# Patient Record
Sex: Female | Born: 1990 | Race: White | Hispanic: Yes | Marital: Single | Smoking: Current every day smoker
Health system: Southern US, Community
[De-identification: ages and names within clinical notes are randomized; demographics above are authoritative.]

## PROBLEM LIST (undated history)

## (undated) DIAGNOSIS — J45909 Unspecified asthma, uncomplicated: Secondary | ICD-10-CM

## (undated) HISTORY — PX: CYST EXCISION: SHX5701

---
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
## 2013-11-17 ENCOUNTER — Emergency Department (HOSPITAL_BASED_OUTPATIENT_CLINIC_OR_DEPARTMENT_OTHER): Payer: Self-pay

## 2013-11-17 ENCOUNTER — Encounter (HOSPITAL_BASED_OUTPATIENT_CLINIC_OR_DEPARTMENT_OTHER): Payer: Self-pay | Admitting: Emergency Medicine

## 2013-11-17 ENCOUNTER — Emergency Department (HOSPITAL_BASED_OUTPATIENT_CLINIC_OR_DEPARTMENT_OTHER)
Admission: EM | Admit: 2013-11-17 | Discharge: 2013-11-17 | Disposition: A | Discharge status: Home / Self Care | Payer: Self-pay | Attending: Emergency Medicine | Admitting: Emergency Medicine

## 2013-11-17 DIAGNOSIS — N390 Urinary tract infection, site not specified: Secondary | ICD-10-CM | POA: Insufficient documentation

## 2013-11-17 DIAGNOSIS — R110 Nausea: Secondary | ICD-10-CM

## 2013-11-17 DIAGNOSIS — R197 Diarrhea, unspecified: Secondary | ICD-10-CM | POA: Insufficient documentation

## 2013-11-17 DIAGNOSIS — R079 Chest pain, unspecified: Secondary | ICD-10-CM | POA: Insufficient documentation

## 2013-11-17 DIAGNOSIS — J45909 Unspecified asthma, uncomplicated: Secondary | ICD-10-CM | POA: Insufficient documentation

## 2013-11-17 DIAGNOSIS — R112 Nausea with vomiting, unspecified: Secondary | ICD-10-CM | POA: Insufficient documentation

## 2013-11-17 DIAGNOSIS — A5901 Trichomonal vulvovaginitis: Secondary | ICD-10-CM | POA: Insufficient documentation

## 2013-11-17 DIAGNOSIS — Z79899 Other long term (current) drug therapy: Secondary | ICD-10-CM | POA: Insufficient documentation

## 2013-11-17 DIAGNOSIS — Z3202 Encounter for pregnancy test, result negative: Secondary | ICD-10-CM | POA: Insufficient documentation

## 2013-11-17 DIAGNOSIS — G4489 Other headache syndrome: Secondary | ICD-10-CM | POA: Insufficient documentation

## 2013-11-17 DIAGNOSIS — Z720 Tobacco use: Secondary | ICD-10-CM | POA: Insufficient documentation

## 2013-11-17 HISTORY — DX: Unspecified asthma, uncomplicated: J45.909

## 2013-11-17 LAB — WET PREP, GENITAL: Yeast Wet Prep HPF POC: NONE SEEN

## 2013-11-17 LAB — URINALYSIS, ROUTINE W REFLEX MICROSCOPIC
GLUCOSE, UA: NEGATIVE mg/dL
Hgb urine dipstick: NEGATIVE
KETONES UR: 15 mg/dL — AB
Nitrite: NEGATIVE
PH: 6 (ref 5.0–8.0)
Protein, ur: NEGATIVE mg/dL
Specific Gravity, Urine: 1.025 (ref 1.005–1.030)
Urobilinogen, UA: 0.2 mg/dL (ref 0.0–1.0)

## 2013-11-17 LAB — COMPREHENSIVE METABOLIC PANEL
ALBUMIN: 3.8 g/dL (ref 3.5–5.2)
ALK PHOS: 60 U/L (ref 39–117)
ALT: 51 U/L — AB (ref 0–35)
AST: 34 U/L (ref 0–37)
Anion gap: 15 (ref 5–15)
BUN: 4 mg/dL — ABNORMAL LOW (ref 6–23)
CHLORIDE: 103 meq/L (ref 96–112)
CO2: 24 mEq/L (ref 19–32)
Calcium: 9.7 mg/dL (ref 8.4–10.5)
Creatinine, Ser: 0.7 mg/dL (ref 0.50–1.10)
GFR calc Af Amer: >90 mL/min (ref >90)
GFR calc non Af Amer: >90 mL/min (ref >90)
GLUCOSE: 91 mg/dL (ref 70–99)
POTASSIUM: 4.1 meq/L (ref 3.7–5.3)
Sodium: 142 mEq/L (ref 137–147)
TOTAL PROTEIN: 7.9 g/dL (ref 6.0–8.3)
Total Bilirubin: 0.2 mg/dL — ABNORMAL LOW (ref 0.3–1.2)

## 2013-11-17 LAB — CBC
HEMATOCRIT: 43.4 % (ref 36.0–46.0)
HEMOGLOBIN: 14.7 g/dL (ref 12.0–15.0)
MCH: 29.8 pg (ref 26.0–34.0)
MCHC: 33.9 g/dL (ref 30.0–36.0)
MCV: 88 fL (ref 78.0–100.0)
Platelets: 344 10*3/uL (ref 150–400)
RBC: 4.93 MIL/uL (ref 3.87–5.11)
RDW: 13.1 % (ref 11.5–15.5)
WBC: 11.7 10*3/uL — AB (ref 4.0–10.5)

## 2013-11-17 LAB — URINE MICROSCOPIC-ADD ON

## 2013-11-17 LAB — PREGNANCY, URINE: PREG TEST UR: NEGATIVE

## 2013-11-17 MED ORDER — KETOROLAC TROMETHAMINE 30 MG/ML IJ SOLN
30.0000 mg | Freq: Once | INTRAMUSCULAR | Status: AC
  Administered 2013-11-17: 30 mg via INTRAVENOUS
  Filled 2013-11-17: qty 1

## 2013-11-17 MED ORDER — KETOROLAC TROMETHAMINE 30 MG/ML IJ SOLN
INTRAMUSCULAR | Status: AC
  Filled 2013-11-17: qty 1

## 2013-11-17 MED ORDER — SODIUM CHLORIDE 0.9 % IV BOLUS (SEPSIS)
1000.0000 mL | Freq: Once | INTRAVENOUS | Status: AC
  Administered 2013-11-17: 1000 mL via INTRAVENOUS

## 2013-11-17 MED ORDER — PROMETHAZINE HCL 25 MG RE SUPP: 25.0000 mg | Freq: Four times a day (QID) | RECTAL | Status: DC | PRN

## 2013-11-17 MED ORDER — METRONIDAZOLE 500 MG PO TABS: 500.0000 mg | ORAL_TABLET | Freq: Two times a day (BID) | ORAL | Status: DC

## 2013-11-17 MED ORDER — CEPHALEXIN 500 MG PO CAPS: 500.0000 mg | ORAL_CAPSULE | Freq: Three times a day (TID) | ORAL | Status: DC

## 2013-11-17 MED ORDER — AZITHROMYCIN 250 MG PO TABS
1000.0000 mg | ORAL_TABLET | Freq: Once | ORAL | Status: AC
  Administered 2013-11-17: 1000 mg via ORAL
  Filled 2013-11-17: qty 4

## 2013-11-17 MED ORDER — DICYCLOMINE HCL 10 MG/ML IM SOLN
20.0000 mg | Freq: Once | INTRAMUSCULAR | Status: AC
  Administered 2013-11-17: 20 mg via INTRAMUSCULAR
  Filled 2013-11-17: qty 2

## 2013-11-17 MED ORDER — ONDANSETRON HCL 4 MG PO TABS: 4.0000 mg | ORAL_TABLET | Freq: Four times a day (QID) | ORAL | Status: DC

## 2013-11-17 MED ORDER — ONDANSETRON HCL 4 MG/2ML IJ SOLN
4.0000 mg | Freq: Once | INTRAMUSCULAR | Status: AC
  Administered 2013-11-17: 4 mg via INTRAVENOUS
  Filled 2013-11-17: qty 2

## 2013-11-17 MED ORDER — CEFTRIAXONE SODIUM 250 MG IJ SOLR
250.0000 mg | Freq: Once | INTRAMUSCULAR | Status: AC
  Administered 2013-11-17: 250 mg via INTRAMUSCULAR
  Filled 2013-11-17: qty 250

## 2013-11-17 NOTE — ED Notes (Signed)
Per RN Pt given gram crackers and ginger ale

## 2013-11-17 NOTE — Discharge Instructions (Signed)
Call for a follow up appointment with a Family or Primary Care Provider for your ongoing headache, chest pain, diarrhea, abnormal vaginal discharge. Your chest X-ray was normal, your EKG was without concerning abnormalities. Call a cardiologist for further evaluation of your chest pain. Return if Symptoms worsen.   Take medication as prescribed.  Drink plenty of fluids.  You have been treated in the emergency department for an infection, possibly sexually transmitted. Results of your gonorrhea and chlamydia tests are pending and you will be notified if they are positive. It is very important to practice safe sex and use condoms when sexually active. If your results are positive you need to notify all sexual partners so they can be treated as well. The website https://garcia.net/http://www.dontspreadit.com/ can be used to send anonymous text messages or emails to alert sexual contacts. Follow up with your doctor, or OBGYN in regards to today's visit.    Gonorrhea and Chlamydia SYMPTOMS  In females, symptoms may go unnoticed. Symptoms that are more noticeable can include:  Belly (abdominal) pain.  Painful intercourse.  Watery mucous-like discharge from the vagina.  Miscarriage.  Discomfort when urinating.  Inflammation of the rectum.  Abnormal gray-green frothy vaginal discharge  Vaginal itching and irritatio  Itching and irritation of the area outside the vagina.   Painful urination.  Bleeding after sexual intercourse.  In males, symptoms include:  Burning with urination.  Pain in the testicles.  Watery mucous-like discharge from the penis.  It can cause longstanding (chronic) pelvic pain after frequent infections.  TREATMENT  PID can cause women to not be able to have children (sterile) if left untreated or if half-treated.  It is important to finish ALL medications given to you.  This is a sexually transmitted infection. So you are also at risk for other sexually transmitted diseases, including HIV  (AIDS), it is recommended that you get tested. HOME CARE INSTRUCTIONS  Warning: This infection is contagious. Do not have sex until treatment is completed. Follow up at your caregiver's office or the clinic to which you were referred. If your diagnosis (learning what is wrong) is confirmed by culture or some other method, your recent sexual contacts need treatment. Even if they are symptom free or have a negative culture or evaluation, they should be treated.  PREVENTION  Women should use sanitary pads instead of tampons for vaginal discharge.  Wipe front to back after using the toilet and avoid douching.   Practice safe sex, use condoms, have only one sex partner and be sure your sex partner is not having sex with others.  Ask your caregiver to test you for chlamydia at your regular checkups or sooner if you are having symptoms.  Ask for further information if you are pregnant.  SEEK IMMEDIATE MEDICAL CARE IF:  You develop an oral temperature above 102 F (38.9 C), not controlled by medications or lasting more than 2 days.  You develop an increase in pain.  You develop any type of abnormal discharge.  You develop vaginal bleeding and it is not time for your period.  You develop painful intercourse.   Bacterial Vaginosis  Bacterial vaginosis (BV) is a vaginal infection where the normal balance of bacteria in the vagina is disrupted. This is not a sexually transmitted disease and your sexual partners do NOT need to be treated. CAUSES  The cause of BV is not fully understood. BV develops when there is an increase or imbalance of harmful bacteria.  Some activities or behaviors can upset  the normal balance of bacteria in the vagina and put women at increased risk including:  Having a new sex partner or multiple sex partners.  Douching.  Using an intrauterine device (IUD) for contraception.  It is not clear what role sexual activity plays in the development of BV. However, women that have never  had sexual intercourse are rarely infected with BV.  Women do not get BV from toilet seats, bedding, swimming pools or from touching objects around them.   SYMPTOMS  Grey vaginal discharge.  A fish-like odor with discharge, especially after sexual intercourse.  Itching or burning of the vagina and vulva.  Burning or pain with urination.  Some women have no signs or symptoms at all.   TREATMENT  Sometimes BV will clear up without treatment.  BV may be treated with antibiotics.  BV can recur after treatment. If this happens, a second round of antibiotics will often be prescribed.  HOME CARE INSTRUCTIONS  Finish all medication as directed by your caregiver.  Do not have sex until treatment is completed.  Do NOT drink any alcoholic beverages while being treated  with Metronidazole (Flagyl). This will cause a severe reaction inducing vomiting.  RESOURCE GUIDE  Dental Problems  Patients with Medicaid: [facility]                     [facility] [phone]                                           [phone] Phone:  [phone]                                                  Phone:  [phone]  If unable to pay or uninsured, contact:  [facility] or [facility] to become qualified for the adult dental clinic.  Chronic Pain Problems Contact [facility]  [phone] Patients need to be referred by their primary care doctor.  Insufficient Money for Medicine Contact United Way:  call "211" or [facility] [phone].  No Primary Care Doctor Call [facility]  [phone] Other agencies that provide inexpensive medical care    [facility]  [phone]    [facility]  [phone]    [facility]  [phone]    [facility]  [phone]    [facility]  [phone]    [facility]  [phone]  Psychological Services [facility]  [phone] [facility]   [phone] [facility]   [phone] (emergency services [phone]  Substance Abuse Resources [facility]  [phone] [facility] [phone] [facility] [phone] [facility] [phone] Residential & Outpatient Substance Abuse Program  [phone]  Abuse/Neglect [facility] Hotline [phone] [facility] Hotline 617 [phone] (After Hours)  Emergency Shelter [facility] [phone]  M[facility] [place] [facility] [place] [phone] [facility] [phone]  MRSA Hotline #:   936-696-8240506 [phone]    [facility]  [facility]     [employer]  [facility] [address]      [place] Phone:  [phone]                                   Phone:  [phone]                 Phone:  [place] Phone:  [place] [phone] (After Hours)

## 2013-11-17 NOTE — ED Provider Notes (Signed)
CSN: [id number]     Arrival date & time 11/17/13  1751 History   First MD Initiated Contact with Patient 11/17/13 1826     Chief Complaint  Patient presents with  . Nausea     (Consider location/radiation/quality/duration/timing/severity/associated sxs/prior Treatment) HPI Comments: The patient is a 23 year old female history of Trichomonas  presenting to the emergency department multiple complaints. The patient reports nausea over the last 2 weeks, with 2-3 episodes of loose stool per day. She reports one episode of nonbloody emesis. She reports low abdominal pain. She denies urinary symptoms. Patient reports abnormal vaginal discharge onset this week. She reports sexually active uses condoms with every sexual encounter. Patient's father is concerned about H. pylori infection. Patient complains of chest pain intermittent several weeks, last episode yesterday nonexertional. Patient also complains of headache, occurring intermittently over the last 1 year, usually occurring in the morning. She reports headache "coming from her eyes" and was advised to get glasses, she reports getting glasses last year without full resolution of symptoms. The patient denies treatment prior to arrival. States might be related to bleach use in drain due to sewage backup at current residence. NO PCP   The history is provided by the patient. No language interpreter was used.    Past Medical History  Diagnosis Date  . Asthma    Past Surgical History  Procedure Laterality Date  . Cyst excision     No family history on file. History  Substance Use Topics  . Smoking status: Current Every Day Smoker    Types: Cigarettes  . Smokeless tobacco: Never Used  . Alcohol Use: 0.6 oz/week    1 Glasses of wine per week   OB History   Grav Para Term Preterm Abortions TAB SAB Ect Mult Living                 Review of Systems  Constitutional: Negative for fever and chills.  Respiratory: Negative for cough and  shortness of breath.   Cardiovascular: Positive for chest pain. Negative for palpitations and leg swelling.  Gastrointestinal: Positive for nausea, abdominal pain and diarrhea. Negative for blood in stool.  Genitourinary: Positive for vaginal discharge. Negative for dysuria and hematuria.      Allergies  Review of patient's allergies indicates no known allergies.  Home Medications   Prior to Admission medications   Medication Sig Start Date End Date Taking? Authorizing Provider  busPIRone (BUSPAR) 10 MG tablet Take 10 mg by mouth 3 (three) times daily.   Yes Historical Provider, MD  RANITIDINE HCL PO Take by mouth.   Yes Historical Provider, MD   BP 151/70  Pulse 103  Temp(Src) 97.9 F (36.6 C) (Oral)  Resp 20  SpO2 97%  LMP 11/03/2013 Physical Exam  Nursing note and vitals reviewed. Constitutional: She is oriented to person, place, and time. She appears well-developed and well-nourished.  Non-toxic appearance. She does not have a sickly appearance. She does not appear ill. No distress.  Pt reaching up turning on TV upon entering room.  HENT:  Head: Normocephalic and atraumatic.  Eyes: EOM are normal. Pupils are equal, round, and reactive to light.  Neck: Neck supple.  Cardiovascular: Normal rate and regular rhythm.   No lower extremity edema  Pulmonary/Chest: Effort normal. She has no wheezes. She has no rales.  Patient able to speak in complete sentences  Abdominal: Soft. There is tenderness in the suprapubic area. There is no rebound and no guarding.  Obese abdomen. Mild suprapubic discomfort  with palpation.  Genitourinary: Cervix exhibits motion tenderness and friability. Right adnexum displays no mass and no tenderness. Left adnexum displays no mass and no tenderness.  Moderate amount of green discharge and posterior cervical vault.Chaperone present.    Neurological: She is alert and oriented to person, place, and time.  Skin: Skin is warm and dry. She is not  diaphoretic.  Psychiatric: She has a normal mood and affect. Her behavior is normal.    ED Course  Procedures (including critical care time) Labs Review Labs Reviewed  WET PREP, GENITAL - Abnormal; Notable for the following:    Trich, Wet Prep TOO NUMEROUS TO COUNT (*)    Clue Cells Wet Prep HPF POC TOO NUMEROUS TO COUNT (*)    WBC, Wet Prep HPF POC TOO NUMEROUS TO COUNT (*)    All other components within normal limits  URINALYSIS, ROUTINE W REFLEX MICROSCOPIC - Abnormal; Notable for the following:    APPearance CLOUDY (*)    Bilirubin Urine SMALL (*)    Ketones, ur 15 (*)    Leukocytes, UA LARGE (*)    All other components within normal limits  CBC - Abnormal; Notable for the following:    WBC 11.7 (*)    All other components within normal limits  COMPREHENSIVE METABOLIC PANEL - Abnormal; Notable for the following:    BUN 4 (*)    ALT 51 (*)    Total Bilirubin 0.2 (*)    All other components within normal limits  URINE MICROSCOPIC-ADD ON - Abnormal; Notable for the following:    Squamous Epithelial / LPF MANY (*)    Bacteria, UA MANY (*)    All other components within normal limits  GC/CHLAMYDIA PROBE AMP  URINE CULTURE  PREGNANCY, URINE    Imaging Review Dg Chest 2 View  11/17/2013   CLINICAL DATA:  Chest pain.  Initial encounter  EXAM: CHEST  2 VIEW  COMPARISON:  None.  FINDINGS: Normal heart size and mediastinal contours. No acute infiltrate or edema. No effusion or pneumothorax. No acute osseous findings.  IMPRESSION: No active cardiopulmonary disease.   Electronically Signed   By: Tiburcio PeaJonathan  Watts M.D.   On: 11/17/2013 20:48     EKG Interpretation   Date/Time:  Sunday November 17 2013 20:58:12 EDT Ventricular Rate:  70 PR Interval:  156 QRS Duration: 102 QT Interval:  416 QTC Calculation: 449 R Axis:   -6 Text Interpretation:  Normal sinus rhythm with sinus arrhythmia Normal ECG  Confirmed by DELOS  MD, DOUGLAS ([id number]) on 11/17/2013 10:40:47 PM      MDM    Final diagnoses:  Chest pain  Diarrhea  Headache syndrome  Nausea  UTI (lower urinary tract infection)  Trichomonal vaginitis   Patient presents with multiple complaints including nausea for 2 weeks, loose stool for 2 weeks, abnormal vaginal discharge, headache over the past one year currently asymptomatic, intermittent chest pain non-exertional over the last several months chest pain-free and ED. Negative urine pregnancy UA shows infection and Trichomonas. Reevaluation patient resting comfortably in room on computer playing games and texting stating Discussed urinalysis results with the patient. And treatment plan. Patient became angry and stated  "what about my head, what about my chest pain". Discussed at length several times patient needs to follow-up with a primary care provider and that chronic symptoms which are currently asymptomatic in the ED can be worked out as an outpatient patient basis. Patient became even more upset and said you're doing nothing for me.  Discussed IV fluids that were given, Toradol, Zofran for nausea, treatment for UTI and Trichomonas infection and pelvic for further evaluation of abnormal discharge. I feel that this is adequate workup and treatment for patient's presenting symptoms. I don't feel that the patient needs a CT to evaluate her diarrhea and nausea. And will likely need follow-up outpatient. X-ray and EKG ordered. EKG without concerning abnormalities and x-ray is negative for findings. Plan to treat for acute cervicitis and trichomonal infection. Wet prep shows clue cells, metronidazole given to cover for BV. Patient elected to get treated for gonorrhea and chlamydia CBC shows slight leukocytosis of 11.7, no other abnormalities, afebrile. CMP without concerning abnormalities. No clear reason for nausea and diarrhea. EKG and x-ray is negative for findings. Reevaluation patient resting comfortably in the room with continues to play computer games. Discussed  lab results, imaging results, and treatment plan with the patient and patient's father. Return precautions given. Reports understanding and no other concerns at this time.  Patient is stable for discharge at this time.  Meds given in ED:  Medications  ondansetron (ZOFRAN) injection 4 mg (4 mg Intravenous Given 11/17/13 1935)  sodium chloride 0.9 % bolus 1,000 mL (0 mLs Intravenous Stopped 11/17/13 2139)  dicyclomine (BENTYL) injection 20 mg (20 mg Intramuscular Given 11/17/13 1935)  ketorolac (TORADOL) 30 MG/ML injection 30 mg (30 mg Intravenous Given 11/17/13 2100)  cefTRIAXone (ROCEPHIN) injection 250 mg (250 mg Intramuscular Given 11/17/13 2100)  azithromycin (ZITHROMAX) tablet 1,000 mg (1,000 mg Oral Given 11/17/13 2059)  ketorolac (TORADOL) 30 MG/ML injection (  Duplicate 11/17/13 2100)    Discharge Medication List as of 11/17/2013  9:34 PM    START taking these medications   Details  cephALEXin (KEFLEX) 500 MG capsule Take 1 capsule (500 mg total) by mouth 3 (three) times daily., Starting 11/17/2013, Until Discontinued, Print    metroNIDAZOLE (FLAGYL) 500 MG tablet Take 1 tablet (500 mg total) by mouth 2 (two) times daily., Starting 11/17/2013, Until Discontinued, Print    ondansetron (ZOFRAN) 4 MG tablet Take 1 tablet (4 mg total) by mouth every 6 (six) hours., Starting 11/17/2013, Until Discontinued, Print    promethazine (PHENERGAN) 25 MG suppository Place 1 suppository (25 mg total) rectally every 6 (six) hours as needed for nausea or vomiting., Starting 11/17/2013, Until Discontinued, Print         Mellody Drown, PA-C 11/18/13 2234

## 2013-11-17 NOTE — ED Notes (Signed)
Pt discharged to home with family. NAD.  

## 2013-11-17 NOTE — ED Notes (Addendum)
Pt reports nausea x 2 weeks- headache also- intermittent chest pain/heart burn also

## 2013-11-18 LAB — GC/CHLAMYDIA PROBE AMP
CT PROBE, AMP APTIMA: NEGATIVE
GC PROBE AMP APTIMA: NEGATIVE

## 2013-11-19 LAB — URINE CULTURE

## 2013-11-19 NOTE — ED Provider Notes (Signed)
Medical screening examination/treatment/procedure(s) were performed by non-physician practitioner and as supervising physician I was immediately available for consultation/collaboration.   EKG Interpretation   Date/Time:  Sunday November 17 2013 20:58:12 EDT Ventricular Rate:  70 PR Interval:  156 QRS Duration: 102 QT Interval:  416 QTC Calculation: 449 R Axis:   -6 Text Interpretation:  Normal sinus rhythm with sinus arrhythmia Normal ECG  Confirmed by Malva CoganELOS  MD, Janaisha Tolsma ([id number]) on 11/17/2013 10:40:47 PM       Geoffery Lyonsouglas Sumaiyah Markert, MD 11/19/13 [phone]

## 2013-11-20 ENCOUNTER — Telehealth (HOSPITAL_COMMUNITY): Payer: Self-pay

## 2013-11-20 NOTE — ED Notes (Signed)
Post ED Visit - Positive Culture Follow-up  Culture report reviewed by antimicrobial stewardship pharmacist: []  Wes Dulaney, Pharm.D., BCPS []  Celedonio MiyamotoJeremy Frens, [address] BCPS []  Georgina PillionElizabeth Martin, [address] BCPS []  [place]h Pham, [place]D., BCPS, AAHIVP [x]  Estella HuskMichelle Turner, Pharm.D., BCPS, AAHIVP []  Carly Sabat, Pharm.D. []  Enzo BiNathan Batchelder, [address]  Positive urine culture Treated with cephalexin, organism sensitive to the same and no further patient follow-up is required at this time.  Ashley JacobsFesterman, Marcelino Campos C 11/20/2013, 11:48 AM

## 2013-11-22 ENCOUNTER — Emergency Department (HOSPITAL_BASED_OUTPATIENT_CLINIC_OR_DEPARTMENT_OTHER)
Admission: EM | Admit: 2013-11-22 | Discharge: 2013-11-22 | Disposition: A | Discharge status: Home / Self Care | Payer: Self-pay | Attending: Emergency Medicine | Admitting: Emergency Medicine

## 2013-11-22 ENCOUNTER — Encounter (HOSPITAL_BASED_OUTPATIENT_CLINIC_OR_DEPARTMENT_OTHER): Payer: Self-pay | Admitting: Emergency Medicine

## 2013-11-22 DIAGNOSIS — Z3202 Encounter for pregnancy test, result negative: Secondary | ICD-10-CM | POA: Insufficient documentation

## 2013-11-22 DIAGNOSIS — R197 Diarrhea, unspecified: Secondary | ICD-10-CM | POA: Insufficient documentation

## 2013-11-22 DIAGNOSIS — Z792 Long term (current) use of antibiotics: Secondary | ICD-10-CM | POA: Insufficient documentation

## 2013-11-22 DIAGNOSIS — R112 Nausea with vomiting, unspecified: Secondary | ICD-10-CM | POA: Insufficient documentation

## 2013-11-22 DIAGNOSIS — J45909 Unspecified asthma, uncomplicated: Secondary | ICD-10-CM | POA: Insufficient documentation

## 2013-11-22 DIAGNOSIS — Z79899 Other long term (current) drug therapy: Secondary | ICD-10-CM | POA: Insufficient documentation

## 2013-11-22 DIAGNOSIS — Z720 Tobacco use: Secondary | ICD-10-CM | POA: Insufficient documentation

## 2013-11-22 LAB — PREGNANCY, URINE: PREG TEST UR: NEGATIVE

## 2013-11-22 LAB — URINALYSIS, ROUTINE W REFLEX MICROSCOPIC
Bilirubin Urine: NEGATIVE
Glucose, UA: NEGATIVE mg/dL
Ketones, ur: NEGATIVE mg/dL
NITRITE: NEGATIVE
PROTEIN: NEGATIVE mg/dL
SPECIFIC GRAVITY, URINE: 1.02 (ref 1.005–1.030)
UROBILINOGEN UA: 0.2 mg/dL (ref 0.0–1.0)
pH: 7.5 (ref 5.0–8.0)

## 2013-11-22 LAB — URINE MICROSCOPIC-ADD ON

## 2013-11-22 MED ORDER — PROMETHAZINE HCL 25 MG/ML IJ SOLN
25.0000 mg | Freq: Once | INTRAMUSCULAR | Status: AC
  Administered 2013-11-22: 25 mg via INTRAMUSCULAR
  Filled 2013-11-22: qty 1

## 2013-11-22 MED ORDER — METRONIDAZOLE 0.75 % VA GEL: 1.0000 | Freq: Two times a day (BID) | VAGINAL | Status: DC

## 2013-11-22 NOTE — ED Notes (Signed)
Pt reports three weeks of vomiting "and when I had Trich before it cleared up" Seen here for same.

## 2013-11-22 NOTE — ED Notes (Addendum)
Pt called here x3 this afternoon asking what to do because she was not feeling any better and continued to vomit. Was seen here 10/25.  Stated that she could not keep her antibiotics down because of vomiting and she was not able to get the phenergan suppositories she was prescribed because they were too expensive.  Pt stated that she did not follow up with pcp.

## 2013-11-22 NOTE — ED Notes (Signed)
Pt reports improvement in nausea - tolerating PO fluids, requesting to take her PO antibiotics, Elpidio AnisShari Upstill, PA made aware - pt also given graham crackers as well.

## 2013-11-22 NOTE — Discharge Instructions (Signed)

## 2013-11-22 NOTE — ED Notes (Signed)
Pt ambulating independently w/ steady gait on d/c in no acute distress, A&Ox4. D/c instructions reviewed w/ pt and family - pt and family deny any further questions or concerns at present. Rx given x1  

## 2013-11-22 NOTE — ED Provider Notes (Signed)
CSN: [id number]     Arrival date & time 11/22/13  1803 History   First MD Initiated Contact with Patient 11/22/13 1814     Chief Complaint  Patient presents with  . Emesis     (Consider location/radiation/quality/duration/timing/severity/associated sxs/prior Treatment) Patient is a 23 y.o. female presenting with vomiting. The history is provided by the patient. No language interpreter was used.  Emesis Severity:  Moderate Duration:  3 weeks Timing:  Constant Associated symptoms: diarrhea   Associated symptoms: no chills and no myalgias   Associated symptoms comment:  She returns to the ED with complaint of persistent vomiting (1-2 per day) and diarrhea for the past 3 weeks. No blood in bowel movements or emesis. No fever. She was seen here on 11/17/13 for same, in addition to other symptoms, and treated with Flagyl, Keflex and Phenergan. She reports being unable to keep down the medications. She has also been unable to follow up outpatient as she has no doctor and has lost her medicaid.   Past Medical History  Diagnosis Date  . Asthma    Past Surgical History  Procedure Laterality Date  . Cyst excision     History reviewed. No pertinent family history. History  Substance Use Topics  . Smoking status: Current Every Day Smoker    Types: Cigarettes  . Smokeless tobacco: Never Used  . Alcohol Use: 0.6 oz/week    1 Glasses of wine per week   OB History   Grav Para Term Preterm Abortions TAB SAB Ect Mult Living                 Review of Systems  Constitutional: Negative for fever and chills.  Respiratory: Negative.  Negative for shortness of breath.   Cardiovascular: Negative.  Negative for chest pain.  Gastrointestinal: Positive for nausea, vomiting and diarrhea. Negative for blood in stool.  Genitourinary: Negative.   Musculoskeletal: Negative.  Negative for myalgias.  Skin: Negative.  Negative for rash.  Neurological: Negative.       Allergies  Review of  patient's allergies indicates no known allergies.  Home Medications   Prior to Admission medications   Medication Sig Start Date End Date Taking? Authorizing Provider  busPIRone (BUSPAR) 10 MG tablet Take 10 mg by mouth 3 (three) times daily.    Historical Provider, MD  cephALEXin (KEFLEX) 500 MG capsule Take 1 capsule (500 mg total) by mouth 3 (three) times daily. 11/17/13   Mellody DrownLauren Parker, PA-C  metroNIDAZOLE (FLAGYL) 500 MG tablet Take 1 tablet (500 mg total) by mouth 2 (two) times daily. 11/17/13   Lauren Parker, PA-C  ondansetron (ZOFRAN) 4 MG tablet Take 1 tablet (4 mg total) by mouth every 6 (six) hours. 11/17/13   Mellody DrownLauren Parker, PA-C  promethazine (PHENERGAN) 25 MG suppository Place 1 suppository (25 mg total) rectally every 6 (six) hours as needed for nausea or vomiting. 11/17/13   Mellody DrownLauren Parker, PA-C  RANITIDINE HCL PO Take by mouth.    Historical Provider, MD   BP 137/68  Pulse 88  Temp(Src) 97.8 F (36.6 C) (Oral)  Resp 18  SpO2 99%  LMP 11/03/2013 Physical Exam  Constitutional: She is oriented to person, place, and time. She appears well-developed and well-nourished.  HENT:  Head: Normocephalic.  Neck: Normal range of motion. Neck supple.  Cardiovascular: Normal rate and regular rhythm.   Pulmonary/Chest: Effort normal and breath sounds normal.  Abdominal: Soft. Bowel sounds are normal. There is no tenderness. There is no rebound and no guarding.  Musculoskeletal: Normal range of motion.  Neurological: She is alert and oriented to person, place, and time.  Skin: Skin is warm and dry. No rash noted.  Psychiatric: She has a normal mood and affect.    ED Course  Procedures (including critical care time) Labs Review Labs Reviewed  URINALYSIS, ROUTINE W REFLEX MICROSCOPIC - Abnormal; Notable for the following:    APPearance CLOUDY (*)    Hgb urine dipstick MODERATE (*)    Leukocytes, UA MODERATE (*)    All other components within normal limits  URINE MICROSCOPIC-ADD  ON - Abnormal; Notable for the following:    Squamous Epithelial / LPF MANY (*)    Bacteria, UA FEW (*)    All other components within normal limits  PREGNANCY, URINE    Imaging Review No results found.   EKG Interpretation None      MDM   Final diagnoses:  None    1. N, V, D  Chart reviewed. The patient was treated for vaginal infection with Flagyl. She declines to have a repeat pelvic exam.  She reports constantly running to the bathroom at home to move her bowels, however, she has been infrequently here. No vomiting. On re-evaluation she appears comfortable, is doing paperwork on the bed, in NAD. VSS. No tachycardia that would suggest dehydration. She is tolerating PO fluids here, as well as having taken one of her antibiotics without difficulty.   Feel she is stable for discharge home and encouraged follow up with outpatient clinic per the resource list provided on previous encounter.     Arnoldo HookerShari A Symphany Fleissner, PA-C 11/22/13 2038  Rolland PorterMark James, MD 11/29/13 [phone]

## 2014-01-24 ENCOUNTER — Emergency Department (HOSPITAL_BASED_OUTPATIENT_CLINIC_OR_DEPARTMENT_OTHER)
Admission: EM | Admit: 2014-01-24 | Discharge: 2014-01-24 | Disposition: A | Discharge status: Home / Self Care | Payer: Self-pay | Attending: Emergency Medicine | Admitting: Emergency Medicine

## 2014-01-24 ENCOUNTER — Encounter (HOSPITAL_BASED_OUTPATIENT_CLINIC_OR_DEPARTMENT_OTHER): Payer: Self-pay | Admitting: Emergency Medicine

## 2014-01-24 DIAGNOSIS — R112 Nausea with vomiting, unspecified: Secondary | ICD-10-CM | POA: Insufficient documentation

## 2014-01-24 DIAGNOSIS — J45909 Unspecified asthma, uncomplicated: Secondary | ICD-10-CM | POA: Insufficient documentation

## 2014-01-24 DIAGNOSIS — H6592 Unspecified nonsuppurative otitis media, left ear: Secondary | ICD-10-CM | POA: Insufficient documentation

## 2014-01-24 DIAGNOSIS — Z720 Tobacco use: Secondary | ICD-10-CM | POA: Insufficient documentation

## 2014-01-24 DIAGNOSIS — R51 Headache: Secondary | ICD-10-CM | POA: Insufficient documentation

## 2014-01-24 DIAGNOSIS — J029 Acute pharyngitis, unspecified: Secondary | ICD-10-CM | POA: Insufficient documentation

## 2014-01-24 LAB — RAPID STREP SCREEN (MED CTR MEBANE ONLY): Streptococcus, Group A Screen (Direct): NEGATIVE

## 2014-01-24 MED ORDER — AMOXICILLIN 500 MG PO CAPS: 500.0000 mg | ORAL_CAPSULE | Freq: Three times a day (TID) | ORAL | Status: DC

## 2014-01-24 MED ORDER — HYDROCODONE-ACETAMINOPHEN 7.5-325 MG/15ML PO SOLN: 10.0000 mL | ORAL | Status: DC | PRN

## 2014-01-24 MED ORDER — DEXAMETHASONE SODIUM PHOSPHATE 10 MG/ML IJ SOLN
10.0000 mg | Freq: Once | INTRAMUSCULAR | Status: AC
  Administered 2014-01-24: 10 mg via INTRAMUSCULAR
  Filled 2014-01-24: qty 1

## 2014-01-24 NOTE — ED Provider Notes (Signed)
CSN: [id number]     Arrival date & time 01/24/14  2112 History  This chart was scribed for Rolan Bucco, MD by Bronson Curb, ED Scribe. This patient was seen in room MH03/MH03 and the patient's care was started at 9:37 PM.     Chief Complaint  Patient presents with  . Shortness of Breath    The history is provided by the patient. No language interpreter was used.     HPI Comments: Tamara Hoover is a 24 y.o. female, with history of asthma, who presents to the Emergency Department complaining of constant sore throat for the past 2-3 days. There is associated throat swelling, nasal congestion, pain with swallowing, bilateral otalgia, HA, nausea, vomiting, and cramping abdominal pain. She also notes her sore  throat is worse with deep breathing. Patient reports history of frequent ear infections and suspects her otalgia is related. She reports she has tried ibuprofen and several OTC medications without significant improvement. She denies cough, rash, or chest congestion.   Past Medical History  Diagnosis Date  . Asthma    Past Surgical History  Procedure Laterality Date  . Cyst excision     No family history on file. History  Substance Use Topics  . Smoking status: Current Every Day Smoker    Types: Cigarettes  . Smokeless tobacco: Never Used  . Alcohol Use: 0.6 oz/week    1 Glasses of wine per week   OB History    No data available     Review of Systems  Constitutional: Negative for fever, chills, diaphoresis and fatigue.  HENT: Positive for congestion, ear pain (bilateral), sore throat and trouble swallowing. Negative for rhinorrhea and sneezing.   Eyes: Negative.   Respiratory: Positive for shortness of breath. Negative for cough and chest tightness.   Cardiovascular: Negative for chest pain and leg swelling.  Gastrointestinal: Positive for nausea, vomiting and abdominal pain. Negative for diarrhea and blood in stool.  Genitourinary: Negative for frequency, hematuria,  flank pain and difficulty urinating.  Musculoskeletal: Negative for back pain and arthralgias.  Skin: Negative for rash.  Neurological: Positive for headaches. Negative for dizziness, speech difficulty, weakness and numbness.      Allergies  Review of patient's allergies indicates no known allergies.  Home Medications   Prior to Admission medications   Medication Sig Start Date End Date Taking? Authorizing Provider  amoxicillin (AMOXIL) 500 MG capsule Take 1 capsule (500 mg total) by mouth 3 (three) times daily. 01/24/14   Rolan Bucco, MD  busPIRone (BUSPAR) 10 MG tablet Take 10 mg by mouth 3 (three) times daily.    Historical Provider, MD  HYDROcodone-acetaminophen (HYCET) 7.5-325 mg/15 ml solution Take 10 mLs by mouth every 4 (four) hours as needed for moderate pain or severe pain. 01/24/14   Rolan Bucco, MD   Triage Vitals: BP 159/89 mmHg  Pulse 122  Temp(Src) 100.1 F (37.8 C) (Oral)  Resp 23  Ht 5' (1.524 m)  Wt 240 lb (108.863 kg)  BMI 46.87 kg/m2  SpO2 100%  Physical Exam  Constitutional: She is oriented to person, place, and time. She appears well-developed and well-nourished.  HENT:  Head: Normocephalic and atraumatic.  Right Ear: External ear normal.  The left TM is erythematous and bulging. There is positive swelling of the tonsils bilaterally. Uvula is midline. There is new early compromise. She is controlling her secretions. There is no stridor.  Eyes: Pupils are equal, round, and reactive to light.  Neck: Normal range of motion. Neck supple.  Cardiovascular: Normal rate, regular rhythm and normal heart sounds.   Pulmonary/Chest: Effort normal and breath sounds normal. No respiratory distress. She has no wheezes. She has no rales. She exhibits no tenderness.  Abdominal: Soft. Bowel sounds are normal. There is no tenderness. There is no rebound and no guarding.  Musculoskeletal: Normal range of motion. She exhibits no edema.  Lymphadenopathy:    She has no  cervical adenopathy.  Neurological: She is alert and oriented to person, place, and time.  Skin: Skin is warm and dry. No rash noted.  Psychiatric: She has a normal mood and affect.    ED Course  Procedures (including critical care time)  DIAGNOSTIC STUDIES: Oxygen Saturation is 100% on room air, normal by my interpretation.    COORDINATION OF CARE: At 2142 Discussed treatment plan with patient which includes steroid injection. Patient agrees.    Labs Review Labs Reviewed  RAPID STREP SCREEN  CULTURE, GROUP A STREP    Imaging Review No results found.   EKG Interpretation None      MDM   Final diagnoses:  Pharyngitis  OME (otitis media with effusion), left    Patient is given a shot of Decadron. Her rapid strep is negative. Her pharyngitis is likely viral. She was started on amoxicillin for her otitis media. Her rapid strep is negative. Return precautions were given.  I personally performed the services described in this documentation, which was scribed in my presence.  The recorded information has been reviewed and considered.    Rolan Bucco, MD 01/24/14 2245

## 2014-01-24 NOTE — ED Notes (Signed)
2days of nasal congestion  Vomiting  abd pain  sorethroat  Ears hurt etc

## 2014-01-24 NOTE — Discharge Instructions (Signed)
Otitis Media Otitis media is redness, soreness, and puffiness (swelling) in the space just behind your eardrum (middle ear). It may be caused by allergies or infection. It often happens along with a cold. HOME CARE  Take your medicine as told. Finish it even if you start to feel better.  Only take over-the-counter or prescription medicines for pain, discomfort, or fever as told by your doctor.  Follow up with your doctor as told. GET HELP IF:  You have otitis media only in one ear, or bleeding from your nose, or both.  You notice a lump on your neck.  You are not getting better in 3-5 days.  You feel worse instead of better. GET HELP RIGHT AWAY IF:   You have pain that is not helped with medicine.  You have puffiness, redness, or pain around your ear.  You get a stiff neck.  You cannot move part of your face (paralysis).  You notice that the bone behind your ear hurts when you touch it. MAKE SURE YOU:   Understand these instructions.  Will watch your condition.  Will get help right away if you are not doing well or get worse. Document Released: 06/29/2007 Document Revised: 01/15/2013 Document Reviewed: 08/07/2012 [facility] Patient Information 2015 [place], [place]. This information is not intended to replace advice given to you by your health care provider. Make sure you discuss any questions you have with your health care provider.  Pharyngitis Pharyngitis is redness, pain, and swelling (inflammation) of your pharynx.  CAUSES  Pharyngitis is usually caused by infection. Most of the time, these infections are from viruses (viral) and are part of a cold. However, sometimes pharyngitis is caused by bacteria (bacterial). Pharyngitis can also be caused by allergies. Viral pharyngitis may be spread from person to person by coughing, sneezing, and personal items or utensils (cups, forks, spoons, toothbrushes). Bacterial pharyngitis may be spread from person to person by more  intimate contact, such as kissing.  SIGNS AND SYMPTOMS  Symptoms of pharyngitis include:   Sore throat.   Tiredness (fatigue).   Low-grade fever.   Headache.  Joint pain and muscle aches.  Skin rashes.  Swollen lymph nodes.  Plaque-like film on throat or tonsils (often seen with bacterial pharyngitis). DIAGNOSIS  Your health care provider will ask you questions about your illness and your symptoms. Your medical history, along with a physical exam, is often all that is needed to diagnose pharyngitis. Sometimes, a rapid strep test is done. Other lab tests may also be done, depending on the suspected cause.  TREATMENT  Viral pharyngitis will usually get better in 3-4 days without the use of medicine. Bacterial pharyngitis is treated with medicines that kill germs (antibiotics).  HOME CARE INSTRUCTIONS   Drink enough water and fluids to keep your urine clear or pale yellow.   Only take over-the-counter or prescription medicines as directed by your health care provider:   If you are prescribed antibiotics, make sure you finish them even if you start to feel better.   Do not take aspirin.   Get lots of rest.   Gargle with 8 oz of salt water ( tsp of salt per 1 qt of water) as often as every 1-2 hours to soothe your throat.   Throat lozenges (if you are not at risk for choking) or sprays may be used to soothe your throat. SEEK MEDICAL CARE IF:   You have large, tender lumps in your neck.  You have a rash.  You cough  up green, yellow-brown, or bloody spit. SEEK IMMEDIATE MEDICAL CARE IF:   Your neck becomes stiff.  You drool or are unable to swallow liquids.  You vomit or are unable to keep medicines or liquids down.  You have severe pain that does not go away with the use of recommended medicines.  You have trouble breathing (not caused by a stuffy nose). MAKE SURE YOU:   Understand these instructions.  Will watch your condition.  Will get help right  away if you are not doing well or get worse. Document Released: 01/10/2005 Document Revised: 10/31/2012 Document Reviewed: 09/17/2012 [facility] Patient Information 2015 [place], [place]. This information is not intended to replace advice given to you by your health care provider. Make sure you discuss any questions you have with your health care provider.

## 2014-01-27 LAB — CULTURE, GROUP A STREP

## 2015-04-14 ENCOUNTER — Emergency Department (HOSPITAL_BASED_OUTPATIENT_CLINIC_OR_DEPARTMENT_OTHER)
Admission: EM | Admit: 2015-04-14 | Discharge: 2015-04-14 | Disposition: A | Discharge status: Home / Self Care | Payer: Self-pay

## 2015-04-14 NOTE — ED Notes (Signed)
No answer from either waiting area

## 2015-04-14 NOTE — ED Notes (Signed)
No answer when called for triage x 3-both waiting areas and outside

## 2016-08-22 ENCOUNTER — Emergency Department (HOSPITAL_BASED_OUTPATIENT_CLINIC_OR_DEPARTMENT_OTHER)
Admission: EM | Admit: 2016-08-22 | Discharge: 2016-08-22 | Disposition: A | Discharge status: Home / Self Care | Payer: Medicaid Other | Attending: Emergency Medicine | Admitting: Emergency Medicine

## 2016-08-22 ENCOUNTER — Emergency Department (HOSPITAL_BASED_OUTPATIENT_CLINIC_OR_DEPARTMENT_OTHER): Payer: Medicaid Other

## 2016-08-22 ENCOUNTER — Encounter (HOSPITAL_BASED_OUTPATIENT_CLINIC_OR_DEPARTMENT_OTHER): Payer: Self-pay | Admitting: *Deleted

## 2016-08-22 DIAGNOSIS — F17210 Nicotine dependence, cigarettes, uncomplicated: Secondary | ICD-10-CM | POA: Insufficient documentation

## 2016-08-22 DIAGNOSIS — R51 Headache: Secondary | ICD-10-CM | POA: Insufficient documentation

## 2016-08-22 DIAGNOSIS — G44209 Tension-type headache, unspecified, not intractable: Secondary | ICD-10-CM

## 2016-08-22 DIAGNOSIS — R112 Nausea with vomiting, unspecified: Secondary | ICD-10-CM | POA: Insufficient documentation

## 2016-08-22 DIAGNOSIS — Z79899 Other long term (current) drug therapy: Secondary | ICD-10-CM | POA: Insufficient documentation

## 2016-08-22 DIAGNOSIS — R110 Nausea: Secondary | ICD-10-CM

## 2016-08-22 DIAGNOSIS — M545 Low back pain, unspecified: Secondary | ICD-10-CM

## 2016-08-22 DIAGNOSIS — R1084 Generalized abdominal pain: Secondary | ICD-10-CM | POA: Insufficient documentation

## 2016-08-22 DIAGNOSIS — J45909 Unspecified asthma, uncomplicated: Secondary | ICD-10-CM | POA: Insufficient documentation

## 2016-08-22 LAB — URINALYSIS, MICROSCOPIC (REFLEX)

## 2016-08-22 LAB — CBC WITH DIFFERENTIAL/PLATELET
BASOS ABS: 0.1 10*3/uL (ref 0.0–0.1)
BASOS PCT: 1 %
EOS PCT: 2 %
Eosinophils Absolute: 0.3 10*3/uL (ref 0.0–0.7)
HCT: 44.3 % (ref 36.0–46.0)
Hemoglobin: 15.3 g/dL — ABNORMAL HIGH (ref 12.0–15.0)
Lymphocytes Relative: 32 %
Lymphs Abs: 3.5 10*3/uL (ref 0.7–4.0)
MCH: 30.5 pg (ref 26.0–34.0)
MCHC: 34.5 g/dL (ref 30.0–36.0)
MCV: 88.4 fL (ref 78.0–100.0)
MONO ABS: 0.8 10*3/uL (ref 0.1–1.0)
Monocytes Relative: 8 %
Neutro Abs: 6.2 10*3/uL (ref 1.7–7.7)
Neutrophils Relative %: 57 %
PLATELETS: 242 10*3/uL (ref 150–400)
RBC: 5.01 MIL/uL (ref 3.87–5.11)
RDW: 12.8 % (ref 11.5–15.5)
WBC: 10.8 10*3/uL — ABNORMAL HIGH (ref 4.0–10.5)

## 2016-08-22 LAB — PREGNANCY, URINE: Preg Test, Ur: NEGATIVE

## 2016-08-22 LAB — URINALYSIS, ROUTINE W REFLEX MICROSCOPIC
Bilirubin Urine: NEGATIVE
Glucose, UA: NEGATIVE mg/dL
HGB URINE DIPSTICK: NEGATIVE
Ketones, ur: NEGATIVE mg/dL
Nitrite: NEGATIVE
PH: 7 (ref 5.0–8.0)
Protein, ur: NEGATIVE mg/dL
SPECIFIC GRAVITY, URINE: 1.02 (ref 1.005–1.030)

## 2016-08-22 LAB — COMPREHENSIVE METABOLIC PANEL
ALBUMIN: 4.2 g/dL (ref 3.5–5.0)
ALT: 23 U/L (ref 14–54)
AST: 20 U/L (ref 15–41)
Alkaline Phosphatase: 47 U/L (ref 38–126)
Anion gap: 9 (ref 5–15)
BILIRUBIN TOTAL: 0.4 mg/dL (ref 0.3–1.2)
BUN: 10 mg/dL (ref 6–20)
CALCIUM: 9 mg/dL (ref 8.9–10.3)
CO2: 24 mmol/L (ref 22–32)
CREATININE: 0.55 mg/dL (ref 0.44–1.00)
Chloride: 103 mmol/L (ref 101–111)
GFR calc Af Amer: >60 mL/min (ref >60)
GFR calc non Af Amer: >60 mL/min (ref >60)
GLUCOSE: 90 mg/dL (ref 65–99)
Potassium: 4.1 mmol/L (ref 3.5–5.1)
Sodium: 136 mmol/L (ref 135–145)
TOTAL PROTEIN: 7.8 g/dL (ref 6.5–8.1)

## 2016-08-22 LAB — LIPASE, BLOOD: LIPASE: 29 U/L (ref 11–51)

## 2016-08-22 MED ORDER — ONDANSETRON 4 MG PO TBDP
4.0000 mg | ORAL_TABLET | Freq: Once | ORAL | Status: AC
  Administered 2016-08-22: 4 mg via ORAL
  Filled 2016-08-22: qty 1

## 2016-08-22 MED ORDER — PROMETHAZINE HCL 25 MG PO TABS: 25.0000 mg | ORAL_TABLET | Freq: Four times a day (QID) | ORAL | 1 refills | Status: DC | PRN

## 2016-08-22 MED ORDER — ONDANSETRON 4 MG PO TBDP: 4.0000 mg | ORAL_TABLET | Freq: Three times a day (TID) | ORAL | 1 refills | Status: DC | PRN

## 2016-08-22 NOTE — Discharge Instructions (Signed)
For the nausea take either the Phenergan or the Zofran as directed. For the headache would recommend taking Aleve 2 tablets every 12 hours. Primary care follow-up would be important. Return for any new or worse symptoms. Today's workup without any significant findings. As we discussed some of your symptoms could be related to prescription glasses.

## 2016-08-22 NOTE — ED Notes (Signed)
Pt verbalizes understanding of d/c instructions and denies any further needs at this time. 

## 2016-08-22 NOTE — ED Triage Notes (Signed)
Headache and nausea on and off x 2 weeks.

## 2016-08-22 NOTE — ED Notes (Signed)
Pt given cup to attempt urine sample. 

## 2016-08-22 NOTE — ED Notes (Signed)
ED Provider at bedside. 

## 2016-08-22 NOTE — ED Provider Notes (Addendum)
[facility] Provider Note   CSN: [id number] Arrival date & time: 08/22/16  2019 By signing my name below, I, Levon HedgerElizabeth Hall, attest that this documentation has been prepared under the direction and in the presence of Vanetta MuldersZackowski, Zeniyah Peaster, MD . Electronically Signed: Levon HedgerElizabeth Hall, Scribe. 08/22/2016. 10:23 PM.   History   Chief Complaint Chief Complaint  Patient presents with  . Headache    HPI Tamara Hoover is a 26 y.o. female who presents to the Emergency Department complaining of intermittent sinus headache onset two weeks ago. Pt states her headaches often wake her up in the middle of the night and are present when she wakes up in the morning. She reports associated increased nausea. Pt states she is typically "a nauseous person", but this is more severe than normal.   Pt also reports intermittent back and abdominal pain. She describes her abdominal pain as mild, brief "constricting" pain that radiates from her back to her central abdomen. Abdominal pain is present when twisting or in the mornings and in the evenings. Symptoms are unchanged by food. She also reports one episode of vomiting yesterday shortly after eating pizza. She has worn glasses for about 3-4 years. Pt is not currently followed by a PCP. She denies use of blood thinners. Pt denies any fever, chills, rhinorrhea, sore throat, cough, visual disturbance, SOB, CP, diarrhea, dysuria, hematuria, or rash.  The history is provided by the patient. No language interpreter was used.  Headache   This is a recurrent problem. The current episode started more than 1 week ago. The problem occurs every few hours. The problem has not changed since onset.The headache is associated with an unknown factor. The pain is located in the frontal region. The pain is moderate. The pain does not radiate. Associated symptoms include vomiting. Pertinent negatives include no fever, no shortness of breath and no nausea. She has tried resting in a  darkened room for the symptoms. The treatment provided no relief.    Past Medical History:  Diagnosis Date  . Asthma     There are no active problems to display for this patient.   Past Surgical History:  Procedure Laterality Date  . CYST EXCISION      OB History    No data available       Home Medications    Prior to Admission medications   Medication Sig Start Date End Date Taking? Authorizing Provider  amoxicillin (AMOXIL) 500 MG capsule Take 1 capsule (500 mg total) by mouth 3 (three) times daily. 01/24/14   Rolan BuccoBelfi, Melanie, MD  busPIRone (BUSPAR) 10 MG tablet Take 10 mg by mouth 3 (three) times daily.    [provider]  HYDROcodone-acetaminophen (HYCET) 7.5-325 mg/15 ml solution Take 10 mLs by mouth every 4 (four) hours as needed for moderate pain or severe pain. 01/24/14   Rolan BuccoBelfi, Melanie, MD  ondansetron (ZOFRAN ODT) 4 MG disintegrating tablet Take 1 tablet (4 mg total) by mouth every 8 (eight) hours as needed. 08/22/16   Vanetta MuldersZackowski, Johna Kearl, MD  promethazine (PHENERGAN) 25 MG tablet Take 1 tablet (25 mg total) by mouth every 6 (six) hours as needed for nausea or vomiting. 08/22/16   Vanetta MuldersZackowski, Sheli Dorin, MD    Family History No family history on file.  Social History Social History  Substance Use Topics  . Smoking status: Current Every Day Smoker    Types: Cigarettes  . Smokeless tobacco: Never Used  . Alcohol use 0.6 oz/week    1 Glasses of wine  per week     Allergies   Patient has no known allergies.   Review of Systems Review of Systems  Constitutional: Negative for chills and fever.  HENT: Negative for rhinorrhea, sneezing and sore throat.   Eyes: Negative for visual disturbance.  Respiratory: Negative for cough and shortness of breath.   Cardiovascular: Negative for chest pain.  Gastrointestinal: Positive for abdominal pain and vomiting. Negative for diarrhea and nausea.  Genitourinary: Negative for dysuria and hematuria.  Musculoskeletal: Positive  for back pain and joint swelling.  Skin: Negative for rash.  Neurological: Negative for headaches.  Hematological: Does not bruise/bleed easily.  Psychiatric/Behavioral: Negative for confusion.   Physical Exam Updated Vital Signs BP 124/76 (BP Location: Right Arm)   Pulse 73   Temp 99.1 F (37.3 C) (Oral)   Resp 16   Ht 1.524 m (5')   Wt 111.1 kg (245 lb)   LMP 08/15/2016   SpO2 99%   BMI 47.85 kg/m   Physical Exam  Constitutional: She is oriented to person, place, and time. She appears well-developed and well-nourished. No distress.  HENT:  Head: Normocephalic.  Mucous membranes are moist.  Eyes: Pupils are equal, round, and reactive to light. Conjunctivae and EOM are normal. No scleral icterus.  Neck: Neck supple.  Cardiovascular: Normal rate and regular rhythm.   Pulmonary/Chest: Effort normal. No respiratory distress. She has no wheezes. She has no rales.  Abdominal: Soft. Bowel sounds are normal. There is no tenderness.  Musculoskeletal: Normal range of motion. She exhibits no edema or tenderness.  No pitting edema  Neurological: She is alert and oriented to person, place, and time. No cranial nerve deficit or sensory deficit. She exhibits normal muscle tone. Coordination normal.  Skin: Skin is warm and dry.  Psychiatric: She has a normal mood and affect.  Nursing note and vitals reviewed.  ED Treatments / Results  DIAGNOSTIC STUDIES:  Oxygen Saturation is 100% on RA, normal by my interpretation.    COORDINATION OF CARE:  10:16 PM Discussed treatment plan with pt at bedside and pt agreed to plan.   Labs (all labs ordered are listed, but only abnormal results are displayed) Labs Reviewed  URINALYSIS, ROUTINE W REFLEX MICROSCOPIC - Abnormal; Notable for the following:       Result Value   APPearance CLOUDY (*)    Leukocytes, UA LARGE (*)    All other components within normal limits  URINALYSIS, MICROSCOPIC (REFLEX) - Abnormal; Notable for the following:     Bacteria, UA MANY (*)    Squamous Epithelial / LPF 6-30 (*)    All other components within normal limits  CBC WITH DIFFERENTIAL/PLATELET - Abnormal; Notable for the following:    WBC 10.8 (*)    Hemoglobin 15.3 (*)    All other components within normal limits  PREGNANCY, URINE  LIPASE, BLOOD  COMPREHENSIVE METABOLIC PANEL    EKG  EKG Interpretation None       Radiology Ct Head Wo Contrast  Result Date: 08/22/2016 CLINICAL DATA:  Intermittent headache for the past 2 weeks. Associated nausea. EXAM: CT HEAD WITHOUT CONTRAST TECHNIQUE: Contiguous axial images were obtained from the base of the skull through the vertex without intravenous contrast. COMPARISON:  None. FINDINGS: Brain: No evidence of acute infarction, hemorrhage, hydrocephalus, extra-axial collection or mass lesion/mass effect. Vascular: No hyperdense vessel or unexpected calcification. Skull: Normal. Negative for fracture or focal lesion. Sinuses/Orbits: Left maxillary sinus retention cyst. No paranasal sinus mucosal thickening are air-fluid levels. Unremarkable orbits. Other:  None. IMPRESSION: Normal examination except for a left maxillary sinus retention cyst. Electronically Signed   By: Beckie SaltsSteven  Reid M.D.   On: 08/22/2016 23:00    Procedures Procedures (including critical care time)  Medications Ordered in ED Medications  ondansetron (ZOFRAN-ODT) disintegrating tablet 4 mg (4 mg Oral Given 08/22/16 2245)     Initial Impression / Assessment and Plan / ED Course  I have reviewed the triage vital signs and the nursing notes.  Pertinent labs & imaging results that were available during my care of the patient were reviewed by me and considered in my medical decision making (see chart for details).    Patient with unusual constellation of symptoms.Patient with a complaint of headache on and off behind the eyes for 2 weeks. Is not present at all times. Usually present first time in the morning and at bedtime. Occasionally  occurs during daytime usually last for about 40 minutes. Also there is pain that starts in the back area and radiates around to the central part of the abdomen bilaterally. This also occurs usually first thing in the morning and at bedtime. Not made worse or affected all by food. Associated with some not nausea and one episode of vomiting. The patient experiences nausea frequently.  Workup here today without significant findings. No significant leukocytosis. Differential is still pending. Liver function test are normal lipase is normal. Urinalysis without evidence urinary tract infection. Head CT without any acute findings. If symptoms persist patient will probably require MRI. Patient will also require close follow-up with primary care provider for further evaluation. Will treat for the nausea. With either Zofran ODT or Phenergan.  Patient nontoxic no acute distress. Vital signs without any significant abnormalities.  It is possible that the headache could be related to the eyeglass prescription. She has not been rechecked in several years.   Final Clinical Impressions(s) / ED Diagnoses   Final diagnoses:  Acute non intractable tension-type headache  Acute bilateral low back pain without sciatica  Nausea    New Prescriptions New Prescriptions   ONDANSETRON (ZOFRAN ODT) 4 MG DISINTEGRATING TABLET    Take 1 tablet (4 mg total) by mouth every 8 (eight) hours as needed.   PROMETHAZINE (PHENERGAN) 25 MG TABLET    Take 1 tablet (25 mg total) by mouth every 6 (six) hours as needed for nausea or vomiting.    I personally performed the services described in this documentation, which was scribed in my presence. The recorded information has been reviewed and is accurate.      Vanetta MuldersZackowski, Maziah Smola, MD 08/22/16 2323  Addendum:  CBC differential without any significant abnormalities.  Patient is stable for discharge home and follow-up with primary care provider.    Vanetta MuldersZackowski, Mailynn Everly, MD 08/22/16  [phone]

## 2017-10-30 ENCOUNTER — Other Ambulatory Visit: Payer: Self-pay

## 2017-10-30 ENCOUNTER — Emergency Department (HOSPITAL_BASED_OUTPATIENT_CLINIC_OR_DEPARTMENT_OTHER)
Admission: EM | Admit: 2017-10-30 | Discharge: 2017-10-30 | Disposition: A | Discharge status: Home / Self Care | Payer: Self-pay | Attending: Emergency Medicine | Admitting: Emergency Medicine

## 2017-10-30 ENCOUNTER — Encounter (HOSPITAL_BASED_OUTPATIENT_CLINIC_OR_DEPARTMENT_OTHER): Payer: Self-pay | Admitting: *Deleted

## 2017-10-30 DIAGNOSIS — Z202 Contact with and (suspected) exposure to infections with a predominantly sexual mode of transmission: Secondary | ICD-10-CM | POA: Insufficient documentation

## 2017-10-30 DIAGNOSIS — Z79899 Other long term (current) drug therapy: Secondary | ICD-10-CM | POA: Insufficient documentation

## 2017-10-30 DIAGNOSIS — J45909 Unspecified asthma, uncomplicated: Secondary | ICD-10-CM | POA: Insufficient documentation

## 2017-10-30 DIAGNOSIS — F17210 Nicotine dependence, cigarettes, uncomplicated: Secondary | ICD-10-CM | POA: Insufficient documentation

## 2017-10-30 LAB — URINALYSIS, MICROSCOPIC (REFLEX)

## 2017-10-30 LAB — WET PREP, GENITAL
Sperm: NONE SEEN
Trich, Wet Prep: NONE SEEN
Yeast Wet Prep HPF POC: NONE SEEN

## 2017-10-30 LAB — URINALYSIS, ROUTINE W REFLEX MICROSCOPIC
Bilirubin Urine: NEGATIVE
Glucose, UA: NEGATIVE mg/dL
Hgb urine dipstick: NEGATIVE
Ketones, ur: NEGATIVE mg/dL
Nitrite: POSITIVE — AB
PROTEIN: NEGATIVE mg/dL
Specific Gravity, Urine: 1.02 (ref 1.005–1.030)
pH: 6.5 (ref 5.0–8.0)

## 2017-10-30 LAB — PREGNANCY, URINE: PREG TEST UR: NEGATIVE

## 2017-10-30 MED ORDER — LIDOCAINE-EPINEPHRINE (PF) 1 %-1:200000 IJ SOLN
INTRAMUSCULAR | Status: AC
  Filled 2017-10-30: qty 10

## 2017-10-30 MED ORDER — AZITHROMYCIN 250 MG PO TABS
1000.0000 mg | ORAL_TABLET | Freq: Once | ORAL | Status: AC
  Administered 2017-10-30: 1000 mg via ORAL
  Filled 2017-10-30: qty 4

## 2017-10-30 MED ORDER — CEFTRIAXONE SODIUM 250 MG IJ SOLR
250.0000 mg | Freq: Once | INTRAMUSCULAR | Status: AC
  Administered 2017-10-30: 250 mg via INTRAMUSCULAR
  Filled 2017-10-30: qty 250

## 2017-10-30 NOTE — ED Notes (Signed)
ED Provider at bedside. 
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
## 2017-10-30 NOTE — ED Notes (Signed)
Discharge by Charge Nurse 

## 2017-10-31 LAB — HIV ANTIBODY (ROUTINE TESTING W REFLEX): HIV Screen 4th Generation wRfx: NONREACTIVE

## 2017-10-31 LAB — RPR: RPR Ser Ql: NONREACTIVE

## 2017-10-31 LAB — GC/CHLAMYDIA PROBE AMP (~~LOC~~) NOT AT ARMC
CHLAMYDIA, DNA PROBE: POSITIVE — AB
Neisseria Gonorrhea: POSITIVE — AB

## 2017-11-06 ENCOUNTER — Other Ambulatory Visit: Payer: Self-pay

## 2017-11-06 ENCOUNTER — Emergency Department (HOSPITAL_BASED_OUTPATIENT_CLINIC_OR_DEPARTMENT_OTHER)
Admission: EM | Admit: 2017-11-06 | Discharge: 2017-11-06 | Disposition: A | Discharge status: Home / Self Care | Payer: Self-pay | Attending: Emergency Medicine | Admitting: Emergency Medicine

## 2017-11-06 ENCOUNTER — Encounter (HOSPITAL_BASED_OUTPATIENT_CLINIC_OR_DEPARTMENT_OTHER): Payer: Self-pay

## 2017-11-06 ENCOUNTER — Emergency Department (HOSPITAL_BASED_OUTPATIENT_CLINIC_OR_DEPARTMENT_OTHER): Payer: Self-pay

## 2017-11-06 DIAGNOSIS — F17210 Nicotine dependence, cigarettes, uncomplicated: Secondary | ICD-10-CM | POA: Insufficient documentation

## 2017-11-06 DIAGNOSIS — S0993XA Unspecified injury of face, initial encounter: Secondary | ICD-10-CM | POA: Insufficient documentation

## 2017-11-06 DIAGNOSIS — Y999 Unspecified external cause status: Secondary | ICD-10-CM | POA: Insufficient documentation

## 2017-11-06 DIAGNOSIS — S199XXA Unspecified injury of neck, initial encounter: Secondary | ICD-10-CM | POA: Insufficient documentation

## 2017-11-06 DIAGNOSIS — S0083XA Contusion of other part of head, initial encounter: Secondary | ICD-10-CM | POA: Insufficient documentation

## 2017-11-06 DIAGNOSIS — Y9289 Other specified places as the place of occurrence of the external cause: Secondary | ICD-10-CM | POA: Insufficient documentation

## 2017-11-06 DIAGNOSIS — T07XXXA Unspecified multiple injuries, initial encounter: Secondary | ICD-10-CM

## 2017-11-06 DIAGNOSIS — Y9389 Activity, other specified: Secondary | ICD-10-CM | POA: Insufficient documentation

## 2017-11-06 DIAGNOSIS — S1083XA Contusion of other specified part of neck, initial encounter: Secondary | ICD-10-CM | POA: Insufficient documentation

## 2017-11-06 LAB — PREGNANCY, URINE: PREG TEST UR: NEGATIVE

## 2017-11-06 MED ORDER — AZITHROMYCIN 250 MG PO TABS
1000.0000 mg | ORAL_TABLET | Freq: Once | ORAL | Status: AC
  Administered 2017-11-06: 1000 mg via ORAL
  Filled 2017-11-06: qty 4

## 2017-11-06 MED ORDER — IBUPROFEN 400 MG PO TABS
400.0000 mg | ORAL_TABLET | Freq: Once | ORAL | Status: AC
  Administered 2017-11-06: 400 mg via ORAL
  Filled 2017-11-06: qty 1

## 2017-11-06 MED ORDER — CYCLOBENZAPRINE HCL 10 MG PO TABS: 10.0000 mg | ORAL_TABLET | Freq: Two times a day (BID) | ORAL | 0 refills | Status: DC | PRN

## 2017-11-06 MED ORDER — CEFTRIAXONE SODIUM 250 MG IJ SOLR
250.0000 mg | Freq: Once | INTRAMUSCULAR | Status: AC
  Administered 2017-11-06: 250 mg via INTRAMUSCULAR
  Filled 2017-11-06: qty 250

## 2017-11-06 MED FILL — CYCLOBENZAPRINE HCL 10 MG T: 10 | 10 days supply | Qty: 20 | Fill #0

## 2017-11-06 NOTE — Discharge Instructions (Signed)
Make sure you are taking ibuprofen or Aleve for pain control in addition to the muscle relaxer.  Hot shower or heating pad may also help with the aches and pains.  Just rest the next few days.
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
## 2017-11-06 NOTE — ED Notes (Signed)
Ambulatory, skin warm and dry. Calm.
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
## 2017-11-06 NOTE — ED Notes (Signed)
ED Provider at bedside. 

## 2017-11-06 NOTE — ED Notes (Signed)
Patient transported to CT 

## 2018-08-28 ENCOUNTER — Encounter (HOSPITAL_BASED_OUTPATIENT_CLINIC_OR_DEPARTMENT_OTHER): Payer: Self-pay | Admitting: Emergency Medicine

## 2018-08-28 ENCOUNTER — Emergency Department (HOSPITAL_BASED_OUTPATIENT_CLINIC_OR_DEPARTMENT_OTHER)
Admission: EM | Admit: 2018-08-28 | Discharge: 2018-08-28 | Disposition: A | Discharge status: Home / Self Care | Payer: Medicaid Other | Attending: Emergency Medicine | Admitting: Emergency Medicine

## 2018-08-28 ENCOUNTER — Other Ambulatory Visit: Payer: Self-pay

## 2018-08-28 DIAGNOSIS — R1030 Lower abdominal pain, unspecified: Secondary | ICD-10-CM | POA: Insufficient documentation

## 2018-08-28 DIAGNOSIS — R109 Unspecified abdominal pain: Secondary | ICD-10-CM

## 2018-08-28 DIAGNOSIS — N760 Acute vaginitis: Secondary | ICD-10-CM | POA: Insufficient documentation

## 2018-08-28 DIAGNOSIS — F17210 Nicotine dependence, cigarettes, uncomplicated: Secondary | ICD-10-CM | POA: Insufficient documentation

## 2018-08-28 DIAGNOSIS — B9689 Other specified bacterial agents as the cause of diseases classified elsewhere: Secondary | ICD-10-CM

## 2018-08-28 DIAGNOSIS — R112 Nausea with vomiting, unspecified: Secondary | ICD-10-CM

## 2018-08-28 DIAGNOSIS — R05 Cough: Secondary | ICD-10-CM | POA: Insufficient documentation

## 2018-08-28 DIAGNOSIS — R1013 Epigastric pain: Secondary | ICD-10-CM | POA: Insufficient documentation

## 2018-08-28 DIAGNOSIS — R51 Headache: Secondary | ICD-10-CM | POA: Insufficient documentation

## 2018-08-28 DIAGNOSIS — J45909 Unspecified asthma, uncomplicated: Secondary | ICD-10-CM | POA: Insufficient documentation

## 2018-08-28 LAB — WET PREP, GENITAL
Sperm: NONE SEEN
Trich, Wet Prep: NONE SEEN
Yeast Wet Prep HPF POC: NONE SEEN

## 2018-08-28 LAB — COMPREHENSIVE METABOLIC PANEL
ALT: 30 U/L (ref 0–44)
AST: 22 U/L (ref 15–41)
Albumin: 4 g/dL (ref 3.5–5.0)
Alkaline Phosphatase: 50 U/L (ref 38–126)
Anion gap: 8 (ref 5–15)
BUN: 9 mg/dL (ref 6–20)
CO2: 24 mmol/L (ref 22–32)
Calcium: 8.7 mg/dL — ABNORMAL LOW (ref 8.9–10.3)
Chloride: 107 mmol/L (ref 98–111)
Creatinine, Ser: 0.68 mg/dL (ref 0.44–1.00)
GFR calc Af Amer: >60 mL/min (ref >60)
GFR calc non Af Amer: >60 mL/min (ref >60)
Glucose, Bld: 92 mg/dL (ref 70–99)
Potassium: 4.5 mmol/L (ref 3.5–5.1)
Sodium: 139 mmol/L (ref 135–145)
Total Bilirubin: 0.5 mg/dL (ref 0.3–1.2)
Total Protein: 7.4 g/dL (ref 6.5–8.1)

## 2018-08-28 LAB — CBC WITH DIFFERENTIAL/PLATELET
Abs Immature Granulocytes: 0.04 10*3/uL (ref 0.00–0.07)
Basophils Absolute: 0.1 10*3/uL (ref 0.0–0.1)
Basophils Relative: 1 %
Eosinophils Absolute: 0.5 10*3/uL (ref 0.0–0.5)
Eosinophils Relative: 5 %
HCT: 43.5 % (ref 36.0–46.0)
Hemoglobin: 14.4 g/dL (ref 12.0–15.0)
Immature Granulocytes: 0 %
Lymphocytes Relative: 30 %
Lymphs Abs: 3.5 10*3/uL (ref 0.7–4.0)
MCH: 30.5 pg (ref 26.0–34.0)
MCHC: 33.1 g/dL (ref 30.0–36.0)
MCV: 92.2 fL (ref 80.0–100.0)
Monocytes Absolute: 0.7 10*3/uL (ref 0.1–1.0)
Monocytes Relative: 6 %
Neutro Abs: 6.7 10*3/uL (ref 1.7–7.7)
Neutrophils Relative %: 58 %
Platelets: 371 10*3/uL (ref 150–400)
RBC: 4.72 MIL/uL (ref 3.87–5.11)
RDW: 12.4 % (ref 11.5–15.5)
WBC: 11.5 10*3/uL — ABNORMAL HIGH (ref 4.0–10.5)
nRBC: 0 % (ref 0.0–0.2)

## 2018-08-28 LAB — URINALYSIS, ROUTINE W REFLEX MICROSCOPIC
Bilirubin Urine: NEGATIVE
Glucose, UA: NEGATIVE mg/dL
Hgb urine dipstick: NEGATIVE
Ketones, ur: NEGATIVE mg/dL
Leukocytes,Ua: NEGATIVE
Nitrite: NEGATIVE
Protein, ur: NEGATIVE mg/dL
Specific Gravity, Urine: 1.02 (ref 1.005–1.030)
pH: 7.5 (ref 5.0–8.0)

## 2018-08-28 LAB — LIPASE, BLOOD: Lipase: 28 U/L (ref 11–51)

## 2018-08-28 LAB — PREGNANCY, URINE: Preg Test, Ur: NEGATIVE

## 2018-08-28 MED ORDER — ACETAMINOPHEN 325 MG PO TABS
650.0000 mg | ORAL_TABLET | Freq: Once | ORAL | Status: AC
  Administered 2018-08-28: 16:00:00 650 mg via ORAL
  Filled 2018-08-28: qty 2

## 2018-08-28 MED ORDER — ONDANSETRON HCL 4 MG PO TABS: 4.0000 mg | ORAL_TABLET | Freq: Four times a day (QID) | ORAL | 0 refills | Status: AC

## 2018-08-28 MED ORDER — METRONIDAZOLE 500 MG PO TABS: 500.0000 mg | ORAL_TABLET | Freq: Two times a day (BID) | ORAL | 0 refills | Status: AC

## 2018-08-28 MED ORDER — ONDANSETRON 4 MG PO TBDP
4.0000 mg | ORAL_TABLET | Freq: Once | ORAL | Status: AC
  Administered 2018-08-28: 16:00:00 4 mg via ORAL
  Filled 2018-08-28: qty 1

## 2018-08-28 NOTE — Discharge Instructions (Signed)
You were given a prescription for antibiotics. Please take the antibiotic prescription fully.   You are also given medication to help with your nausea.  Please take as directed.  Do not drink alcohol while taking the antibiotic that you are on.  You were given resources to follow-up with primary care, OB/GYN as well as behavioral health.  Please contact these resources and schedule follow-up as needed.  Please return to the emergency department for any new or worsening symptoms including severe abdominal pain, persistent vomiting, fevers or any new or worsening symptoms.
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
## 2018-08-28 NOTE — ED Triage Notes (Signed)
Vomiting for for two weeks.  Headache for several days.  Pt has chronic cough but unchanged.  Pt denies chest pain or sob.

## 2018-08-28 NOTE — ED Provider Notes (Signed)
[facility] EMERGENCY DEPARTMENT Provider Note   CSN: [id number] Arrival date & time: 08/28/18  1431    History   Chief Complaint Chief Complaint  Patient presents with  . Emesis  . Headache  . Cough    HPI Tamara Hoover is a 28 y.o. female.     HPI   Pt is a 28 y/o female with a h/o asthma who presents to the ED for eval of abd pain, nv for 2 weeks. Pain is constant and she rates it a 6/10. Pain feels sharp and radiates to her back. States she has been vomiting one time in the morning and then does not vomit anymore throughout the day. Denies diarrhea, constipation, fever, dysuria, vaginal bleeding or vaginal discharge. She has tried ibuprofen without relief.   LMP was on 06/25/18. She is currently sexually active with one partner and they do not use protection. She denies concern for STD.   States that she has also had a headache, but this is similar to chronic headaches. She also has a cough 2/2 smoking tobacco that is also chronic. She states that she has had no covid contacts. She has no cp, sob, fevers. She states she is not concerned about these problems and would like to address the abd pain.   Past Medical History:  Diagnosis Date  . Asthma     There are no active problems to display for this patient.   Past Surgical History:  Procedure Laterality Date  . CYST EXCISION       OB History   No obstetric history on file.      Home Medications    Prior to Admission medications   Medication Sig Start Date End Date Taking? Authorizing Provider  metroNIDAZOLE (FLAGYL) 500 MG tablet Take 1 tablet (500 mg total) by mouth 2 (two) times daily. 08/28/18   Calina Patrie S, PA-C  ondansetron (ZOFRAN) 4 MG tablet Take 1 tablet (4 mg total) by mouth every 6 (six) hours. 08/28/18   Quinlyn Tep S, PA-C    Family History No family history on file.  Social History Social History   Tobacco Use  . Smoking status: Current Every Day Smoker    Types:  Cigarettes  . Smokeless tobacco: Never Used  Substance Use Topics  . Alcohol use: Yes    Alcohol/week: 1.0 standard drinks    Types: 1 Glasses of wine per week    Comment: occ  . Drug use: Yes    Types: Marijuana     Allergies   Phenergan [promethazine hcl]   Review of Systems Review of Systems  Constitutional: Negative for fever.  HENT: Negative for ear pain and sore throat.   Eyes: Negative for visual disturbance.  Respiratory: Negative for cough and shortness of breath.   Cardiovascular: Negative for chest pain.  Gastrointestinal: Positive for abdominal pain, nausea and vomiting. Negative for constipation and diarrhea.  Genitourinary: Negative for dysuria, hematuria, vaginal bleeding and vaginal discharge.  Musculoskeletal: Positive for back pain.  Skin: Negative for color change and rash.  Neurological: Negative for headaches.  All other systems reviewed and are negative.    Physical Exam Updated Vital Signs BP (!) 111/47 (BP Location: Right Wrist)   Pulse 74   Temp 98.1 F (36.7 C) (Oral)   Resp 16   Ht 5' (1.524 m)   Wt 113.4 kg   LMP 06/24/2018   SpO2 97%   BMI 48.82 kg/m   Physical Exam Vitals signs and nursing note  reviewed.  Constitutional:      General: She is not in acute distress.    Appearance: She is well-developed. She is not ill-appearing or toxic-appearing.  HENT:     Head: Normocephalic and atraumatic.  Eyes:     Conjunctiva/sclera: Conjunctivae normal.  Neck:     Musculoskeletal: Neck supple.  Cardiovascular:     Rate and Rhythm: Normal rate and regular rhythm.     Heart sounds: Normal heart sounds. No murmur.  Pulmonary:     Effort: Pulmonary effort is normal. No respiratory distress.     Breath sounds: Normal breath sounds.  Abdominal:     General: Bowel sounds are normal. There is no distension.     Palpations: Abdomen is soft.     Tenderness: There is abdominal tenderness (mild lower abd ttp and epigastric ttp ). There is no  guarding.  Genitourinary:    Comments: Exam performed by Karrie Meresortni S Prakash Kimberling,  exam chaperoned Date: 08/28/2018 Pelvic exam: normal external genitalia without evidence of trauma. VULVA: normal appearing vulva with no masses, tenderness or lesion. VAGINA: normal appearing vagina with normal color and discharge, no lesions. CERVIX: normal appearing cervix without lesions, cervical motion tenderness absent, cervical os closed with out purulent discharge; vaginal discharge - present, white, Wet prep and DNA probe for chlamydia and GC obtained.   ADNEXA: normal adnexa in size, nontender and no masses UTERUS: uterus is normal size, shape, consistency and nontender.  Skin:    General: Skin is warm and dry.  Neurological:     Mental Status: She is alert.     Comments: Alert, answering questions appropriately, no facial droop, moving all extremities with normal coordination.     ED Treatments / Results  Labs (all labs ordered are listed, but only abnormal results are displayed) Labs Reviewed  WET PREP, GENITAL - Abnormal; Notable for the following components:      Result Value   Clue Cells Wet Prep HPF POC PRESENT (*)    WBC, Wet Prep HPF POC MANY (*)    All other components within normal limits  CBC WITH DIFFERENTIAL/PLATELET - Abnormal; Notable for the following components:   WBC 11.5 (*)    All other components within normal limits  COMPREHENSIVE METABOLIC PANEL - Abnormal; Notable for the following components:   Calcium 8.7 (*)    All other components within normal limits  URINALYSIS, ROUTINE W REFLEX MICROSCOPIC - Abnormal; Notable for the following components:   APPearance HAZY (*)    All other components within normal limits  LIPASE, BLOOD  PREGNANCY, URINE  GC/CHLAMYDIA PROBE AMP ([place]    EKG None  Radiology No results found.  Procedures Procedures (including critical care time)  Medications Ordered in ED Medications  ondansetron (ZOFRAN-ODT)  disintegrating tablet 4 mg (4 mg Oral Given 08/28/18 1554)  acetaminophen (TYLENOL) tablet 650 mg (650 mg Oral Given 08/28/18 1555)     Initial Impression / Assessment and Plan / ED Course  I have reviewed the triage vital signs and the nursing notes.  Pertinent labs & imaging results that were available during my care of the patient were reviewed by me and considered in my medical decision making (see chart for details).   Final Clinical Impressions(s) / ED Diagnoses   Final diagnoses:  Abdominal pain, unspecified abdominal location  Non-intractable vomiting with nausea, unspecified vomiting type  Bacterial vaginosis   28 year old female presenting for lower abdominal pain ongoing for 2 weeks.  Denies any abnormal  vaginal discharge or bleeding.  Is late with her menstrual cycle.  Is not having any diarrhea or constipation.  She is having one episode of vomiting per day.  She said no fevers.  States she is monogamous and is not concerned for STD.  She has minimal lower abdominal tenderness on exam.  She has no peritoneal signs.  Her pelvic exam shows some white vaginal discharge but no cervical motion tenderness or significant uterine or adnexal tenderness.  Seems less likely to be related to PID.  CBC with mild leukocytosis, no anemia. CMP with normal electrolytes, kidney and liver function. Lipase is negative UA is not suggestive of urinary tract infection Urine pregnancy test is negative Wet prep suggest BV, will treat GC/chlamydia pending at this time.  Patient prefers to hold on prophylactic antibiotics and would like to wait for test results  Patient given Tylenol and Zofran.  On reassessment she continues to not have any peritoneal signs on exam.  She states she cannot wait for further work-up because her boyfriend is a ride and he is going to be leaving.  She became very anxious and started crying about how she is very stressed about her work and social life.  Denies SI.  States she  is to be on medication for anxiety but does not have insurance so cannot follow-up.  I discussed the plan to treat her for BV.  Advised that I will give her information to follow-up with PCP, OB/GYN and [facility].  I will give her some Zofran for her nausea and vomiting as well.  Have advised her to monitor symptoms and return to the ER for new or worsening symptoms in the meantime.   ED Discharge Orders         Ordered    ondansetron (ZOFRAN) 4 MG tablet  Every 6 hours     08/28/18 1811    metroNIDAZOLE (FLAGYL) 500 MG tablet  2 times daily     08/28/18 1811           Rodney Booze, PA-C 08/28/18 2155    Quintella Reichert, MD 08/30/18 [phone]

## 2018-08-30 LAB — GC/CHLAMYDIA PROBE AMP (~~LOC~~) NOT AT ARMC
Chlamydia: NEGATIVE
Neisseria Gonorrhea: NEGATIVE
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
## 2021-06-17 ENCOUNTER — Encounter (HOSPITAL_COMMUNITY): Payer: Self-pay | Admitting: Licensed Clinical Social Worker

## 2021-06-17 ENCOUNTER — Ambulatory Visit (INDEPENDENT_AMBULATORY_CARE_PROVIDER_SITE_OTHER): Payer: No Payment, Other | Admitting: Licensed Clinical Social Worker

## 2021-06-17 DIAGNOSIS — F1210 Cannabis abuse, uncomplicated: Secondary | ICD-10-CM | POA: Diagnosis not present

## 2021-06-17 DIAGNOSIS — F331 Major depressive disorder, recurrent, moderate: Secondary | ICD-10-CM

## 2021-06-17 DIAGNOSIS — F4310 Post-traumatic stress disorder, unspecified: Secondary | ICD-10-CM
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
## 2021-08-04 ENCOUNTER — Ambulatory Visit (HOSPITAL_COMMUNITY): Payer: No Payment, Other | Admitting: Psychiatry

## 2021-08-09 ENCOUNTER — Encounter (HOSPITAL_COMMUNITY): Payer: Self-pay

## 2021-08-09 ENCOUNTER — Ambulatory Visit (HOSPITAL_COMMUNITY): Payer: No Payment, Other | Admitting: Psychiatry

## 2022-05-25 ENCOUNTER — Encounter (HOSPITAL_BASED_OUTPATIENT_CLINIC_OR_DEPARTMENT_OTHER): Payer: Self-pay | Admitting: Emergency Medicine

## 2022-05-25 ENCOUNTER — Emergency Department (HOSPITAL_BASED_OUTPATIENT_CLINIC_OR_DEPARTMENT_OTHER)
Admission: EM | Admit: 2022-05-25 | Discharge: 2022-05-25 | Discharge status: Against Medical Advice | Payer: Medicaid Other | Attending: Emergency Medicine | Admitting: Emergency Medicine

## 2022-05-25 ENCOUNTER — Other Ambulatory Visit: Payer: Self-pay

## 2022-05-25 DIAGNOSIS — R110 Nausea: Secondary | ICD-10-CM | POA: Insufficient documentation

## 2022-05-25 DIAGNOSIS — Z5321 Procedure and treatment not carried out due to patient leaving prior to being seen by health care provider: Secondary | ICD-10-CM | POA: Diagnosis not present

## 2022-05-25 DIAGNOSIS — R109 Unspecified abdominal pain: Secondary | ICD-10-CM | POA: Insufficient documentation

## 2022-05-25 DIAGNOSIS — O26899 Other specified pregnancy related conditions, unspecified trimester: Secondary | ICD-10-CM | POA: Diagnosis present

## 2022-05-25 DIAGNOSIS — Z3A00 Weeks of gestation of pregnancy not specified: Secondary | ICD-10-CM | POA: Diagnosis not present

## 2022-05-25 LAB — URINALYSIS, ROUTINE W REFLEX MICROSCOPIC
Glucose, UA: NEGATIVE mg/dL
Hgb urine dipstick: NEGATIVE
Ketones, ur: NEGATIVE mg/dL
Leukocytes,Ua: NEGATIVE
Nitrite: NEGATIVE
Protein, ur: 30 mg/dL — AB
Specific Gravity, Urine: >=1.03 (ref 1.005–1.030)
pH: 6.5 (ref 5.0–8.0)

## 2022-05-25 LAB — COMPREHENSIVE METABOLIC PANEL
ALT: 29 U/L (ref 0–44)
AST: 25 U/L (ref 15–41)
Albumin: 3.6 g/dL (ref 3.5–5.0)
Alkaline Phosphatase: 39 U/L (ref 38–126)
Anion gap: 10 (ref 5–15)
BUN: 10 mg/dL (ref 6–20)
CO2: 19 mmol/L — ABNORMAL LOW (ref 22–32)
Calcium: 8.6 mg/dL — ABNORMAL LOW (ref 8.9–10.3)
Chloride: 106 mmol/L (ref 98–111)
Creatinine, Ser: 0.73 mg/dL (ref 0.44–1.00)
GFR, Estimated: >60 mL/min (ref >60)
Glucose, Bld: 116 mg/dL — ABNORMAL HIGH (ref 70–99)
Potassium: 3.6 mmol/L (ref 3.5–5.1)
Sodium: 135 mmol/L (ref 135–145)
Total Bilirubin: 0.1 mg/dL — ABNORMAL LOW (ref 0.3–1.2)
Total Protein: 7.3 g/dL (ref 6.5–8.1)

## 2022-05-25 LAB — CBC
HCT: 41.3 % (ref 36.0–46.0)
Hemoglobin: 14 g/dL (ref 12.0–15.0)
MCH: 30.6 pg (ref 26.0–34.0)
MCHC: 33.9 g/dL (ref 30.0–36.0)
MCV: 90.4 fL (ref 80.0–100.0)
Platelets: 343 10*3/uL (ref 150–400)
RBC: 4.57 MIL/uL (ref 3.87–5.11)
RDW: 12.7 % (ref 11.5–15.5)
WBC: 13.5 10*3/uL — ABNORMAL HIGH (ref 4.0–10.5)
nRBC: 0 % (ref 0.0–0.2)

## 2022-05-25 LAB — PREGNANCY, URINE: Preg Test, Ur: POSITIVE — AB

## 2022-05-25 LAB — LIPASE, BLOOD: Lipase: 29 U/L (ref 11–51)

## 2022-05-25 LAB — URINALYSIS, MICROSCOPIC (REFLEX)

## 2022-05-25 NOTE — ED Triage Notes (Signed)
Patient presents to ED via POV from home. Here with lower abdominal pain and cramping. Patient had a recent at home pregnancy test. Last menstrual period was at the end of February. Also reports nausea.

## 2022-06-11 ENCOUNTER — Emergency Department (HOSPITAL_BASED_OUTPATIENT_CLINIC_OR_DEPARTMENT_OTHER)
Admission: EM | Admit: 2022-06-11 | Discharge: 2022-06-11 | Disposition: A | Discharge status: Home / Self Care | Payer: Medicaid Other | Attending: Emergency Medicine | Admitting: Emergency Medicine

## 2022-06-11 ENCOUNTER — Encounter (HOSPITAL_BASED_OUTPATIENT_CLINIC_OR_DEPARTMENT_OTHER): Payer: Self-pay | Admitting: Emergency Medicine

## 2022-06-11 DIAGNOSIS — A64 Unspecified sexually transmitted disease: Secondary | ICD-10-CM

## 2022-06-11 DIAGNOSIS — N72 Inflammatory disease of cervix uteri: Secondary | ICD-10-CM

## 2022-06-11 DIAGNOSIS — N898 Other specified noninflammatory disorders of vagina: Secondary | ICD-10-CM | POA: Diagnosis present

## 2022-06-11 LAB — WET PREP, GENITAL
Clue Cells Wet Prep HPF POC: NONE SEEN
Sperm: NONE SEEN
Trich, Wet Prep: NONE SEEN
WBC, Wet Prep HPF POC: <10 (ref <10)
Yeast Wet Prep HPF POC: NONE SEEN

## 2022-06-11 MED ORDER — AZITHROMYCIN 1 G PO PACK
1.0000 g | PACK | Freq: Once | ORAL | Status: AC
  Administered 2022-06-11: 1 g via ORAL
  Filled 2022-06-11: qty 1

## 2022-06-11 MED ORDER — CEFTRIAXONE SODIUM 500 MG IJ SOLR
500.0000 mg | Freq: Once | INTRAMUSCULAR | Status: AC
  Administered 2022-06-11: 500 mg via INTRAMUSCULAR
  Filled 2022-06-11: qty 500

## 2022-06-11 NOTE — ED Triage Notes (Addendum)
N/V all day today, states thought it was due to pregnancy. Has tried ginger ale, tea and bland diet with no relief. Pt state she and partner has had some GU discharge as well.

## 2022-06-11 NOTE — ED Provider Notes (Signed)
[place] [facility] Provider Note   CSN: [id number] Arrival date & time: 06/11/22  4098     History  Chief Complaint  Patient presents with   Abdominal Pain    Tamara Hoover Eleah Freeh is a 32 y.o. female.  The history is provided by the patient.  Vaginal Discharge Quality:  Clear Severity:  Moderate Onset quality:  Gradual Duration: 1-2 weeks. Timing:  Constant Progression:  Unchanged Chronicity:  New Context: spontaneously   Relieved by:  Nothing Worsened by:  Nothing Ineffective treatments:  None tried Associated symptoms: no fever   Risk factors: unprotected sex   Also having morning sickness.       Home Medications Prior to Admission medications   Medication Sig Start Date End Date Taking? Authorizing Provider  metroNIDAZOLE (FLAGYL) 500 MG tablet Take 1 tablet (500 mg total) by mouth 2 (two) times daily. 08/28/18   Couture, Cortni S, PA-C  ondansetron (ZOFRAN) 4 MG tablet Take 1 tablet (4 mg total) by mouth every 6 (six) hours. 08/28/18   Couture, Cortni S, PA-C      Allergies    Phenergan [promethazine hcl]    Review of Systems   Review of Systems  Constitutional:  Negative for fever.  HENT:  Negative for facial swelling.   Eyes:  Negative for redness.  Respiratory:  Negative for wheezing and stridor.   Genitourinary:  Positive for vaginal discharge.  All other systems reviewed and are negative.   Physical Exam Updated Vital Signs BP 132/78 (BP Location: Right Arm)   Pulse 87   Temp 97.8 F (36.6 C) (Oral)   Resp 16   Ht 5' (1.524 m)   Wt 123.4 kg   LMP 03/14/2022 (Approximate)   SpO2 98%   BMI 53.12 kg/m  Physical Exam Vitals and nursing note reviewed.  Constitutional:      General: She is not in acute distress.    Appearance: Normal appearance. She is well-developed.  HENT:     Head: Normocephalic and atraumatic.     Nose: Nose normal.  Eyes:     Pupils: Pupils are equal, round, and reactive  to light.  Cardiovascular:     Rate and Rhythm: Normal rate and regular rhythm.     Pulses: Normal pulses.     Heart sounds: Normal heart sounds.  Pulmonary:     Effort: Pulmonary effort is normal. No respiratory distress.     Breath sounds: Normal breath sounds.  Abdominal:     General: Bowel sounds are normal. There is no distension.     Palpations: Abdomen is soft.     Tenderness: There is no abdominal tenderness. There is no guarding or rebound.  Genitourinary:    Vagina: No vaginal discharge.  Musculoskeletal:        General: Normal range of motion.     Cervical back: Normal range of motion and neck supple.  Skin:    General: Skin is warm and dry.     Capillary Refill: Capillary refill takes less than 2 seconds.     Findings: No erythema or rash.  Neurological:     General: No focal deficit present.     Mental Status: She is alert.     Deep Tendon Reflexes: Reflexes normal.  Psychiatric:        Mood and Affect: Mood normal.     ED Results / Procedures / Treatments   Labs (all labs ordered are listed, but only abnormal results are  displayed) Labs Reviewed  WET PREP, GENITAL  GC/CHLAMYDIA PROBE AMP ([place]) NOT AT [facility]    EKG None  Radiology No results found.  Procedures Procedures    Medications Ordered in ED Medications  cefTRIAXone (ROCEPHIN) injection 500 mg (500 mg Intramuscular Given 06/11/22 0459)  azithromycin (ZITHROMAX) powder 1 g (1 g Oral Given 06/11/22 0459)    ED Course/ Medical Decision Making/ A&P                             Medical Decision Making Patient and partner here with discharge.  Patient   Amount and/or Complexity of Data Reviewed External Data Reviewed: notes.    Details: Previous notes reviewed  Labs: ordered.  Risk Prescription drug management. Risk Details: Patient and partners with discharge.  Will treat for STDs.  No sexual activity until 7 days after all partners treated.  Stable for discharge.  Strict  return    Final Clinical Impression(s) / ED Diagnoses Final diagnoses:  Cervicitis  Sexually transmitted disease  Return for intractable cough, coughing up blood, fevers > 100.4 unrelieved by medication, shortness of breath, intractable vomiting, chest pain, shortness of breath, weakness, numbness, changes in speech, facial asymmetry, abdominal pain, passing out, Inability to tolerate liquids or food, cough, altered mental status or any concerns. No signs of systemic illness or infection. The patient is nontoxic-appearing on exam and vital signs are within normal limits.  I have reviewed the triage vital signs and the nursing notes. Pertinent labs & imaging results that were available during my care of the patient were reviewed by me and considered in my medical decision making (see chart for details). After history, exam, and medical workup I feel the patient has been appropriately medically screened and is safe for discharge home. Pertinent diagnoses were discussed with the patient. Patient was given return precautions.   Rx / DC Orders ED Discharge Orders     None         Marvene Strohm, MD 06/11/22 [phone]

## 2022-06-13 LAB — GC/CHLAMYDIA PROBE AMP (~~LOC~~) NOT AT ARMC
Chlamydia: NEGATIVE
Comment: NEGATIVE
Comment: NORMAL
Neisseria Gonorrhea: NEGATIVE
